# Patient Record
Sex: Female | Born: 2000 | Race: White | Hispanic: No | Smoking: Never smoker
Health system: Southern US, Community
[De-identification: ages and names within clinical notes are randomized; demographics above are authoritative.]

---
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
## 2023-02-27 ENCOUNTER — Telehealth: Payer: BLUE CROSS/BLUE SHIELD | Admitting: Family Medicine

## 2023-02-27 DIAGNOSIS — J40 Bronchitis, not specified as acute or chronic: Secondary | ICD-10-CM | POA: Diagnosis not present

## 2023-02-27 MED ORDER — AZITHROMYCIN 250 MG PO TABS: ORAL_TABLET | ORAL | 0 refills | Status: AC

## 2023-02-27 NOTE — Progress Notes (Signed)
Virtual Visit Consent   Jennifer Long, you are scheduled for a virtual visit with a [facility] provider today. Just as with appointments in the office, your consent must be obtained to participate. Your consent will be active for this visit and any virtual visit you may have with one of our providers in the next 365 days. If you have a MyChart account, a copy of this consent can be sent to you electronically.  As this is a virtual visit, video technology does not allow for your provider to perform a traditional examination. This may limit your provider's ability to fully assess your condition. If your provider identifies any concerns that need to be evaluated in person or the need to arrange testing (such as labs, EKG, etc.), we will make arrangements to do so. Although advances in technology are sophisticated, we cannot ensure that it will always work on either your end or our end. If the connection with a video visit is poor, the visit may have to be switched to a telephone visit. With either a video or telephone visit, we are not always able to ensure that we have a secure connection.  By engaging in this virtual visit, you consent to the provision of healthcare and authorize for your insurance to be billed (if applicable) for the services provided during this visit. Depending on your insurance coverage, you may receive a charge related to this service.  I need to obtain your verbal consent now. Are you willing to proceed with your visit today? Jennifer Long has provided verbal consent on 02/27/2023 for a virtual visit (video or telephone). Jennifer Curio, FNP  Date: 02/27/2023 6:47 PM  Virtual Visit via Video Note   I, Jennifer Long, connected with  Jennifer Long  ([id number], 23-21-2002) on 02/27/23 at  6:45 PM EST by a video-enabled telemedicine application and verified that I am speaking with the correct person using two identifiers.  Location: Patient: Virtual Visit Location Patient:  Home Provider: Virtual Visit Location Provider: Home Office   I discussed the limitations of evaluation and management by telemedicine and the availability of in person appointments. The patient expressed understanding and agreed to proceed.    History of Present Illness: Jennifer Long is a 23 y.o. who identifies as a female who was assigned female at birth, and is being seen today for cough persisting following having the flu, mucus now green not clear and feeling worse after feeling better. No fever. Marland Kitchen  HPI: HPI  Problems: There are no active problems to display for this patient.   Allergies: Not on File Medications:  Current Outpatient Medications:    azithromycin (ZITHROMAX) 250 MG tablet, Take 2 tablets on day 1, then 1 tablet daily on days 2 through 5, Disp: 6 tablet, Rfl: 0  Observations/Objective: Patient is well-developed, well-nourished in no acute distress.  Resting comfortably  at home.  Head is normocephalic, atraumatic.  No labored breathing.  Speech is clear and coherent with logical content.  Patient is alert and oriented at baseline.    Assessment and Plan: 1. Bronchitis (Primary)  Increase fluids, humidifier at night, tylenol or ibuprofen, mucinex otc. Urgent care if sx persist or worsen.   Follow Up Instructions: I discussed the assessment and treatment plan with the patient. The patient was provided an opportunity to ask questions and all were answered. The patient agreed with the plan and demonstrated an understanding of the instructions.  A copy of instructions were sent to the patient via MyChart unless otherwise  noted below.   The patient was advised to call back or seek an in-person evaluation if the symptoms worsen or if the condition fails to improve as anticipated.    Jennifer Curio, FNP

## 2023-02-27 NOTE — Patient Instructions (Signed)
